# Patient Record
Sex: Male | Born: 2000 | Race: White | Hispanic: No | Marital: Single | Smoking: Never smoker
Health system: Southern US, Community
[De-identification: ages and names within clinical notes are randomized; demographics above are authoritative.]

---
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
## 2020-05-02 IMAGING — MR MRI OF THE RIGHT HIP WITH CONTRAST
4 series · 40 of 40 positions shown · IV contrast (agent unspecified)
Comparison: None.

CLINICAL DATA: right hip discomfort for 3-4 years recent with
peaking motion

EXAM:
MRI OF THE RIGHT HIP WITH CONTRAST
TECHNIQUE: Multiplanar, multisequence MR imaging was performed following the
administration of contrast.
CONTRAST:  intra-articular contrast

[Series 4: T2 fat-sat · coronal · 4.0mm · 1.19mm/px · 11 of 23 slices shown]
[im 1/23]
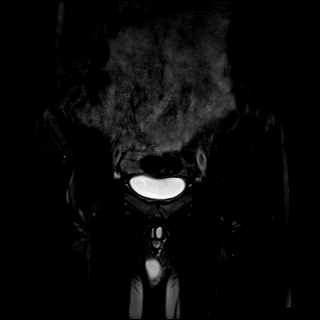
[im 3/23]
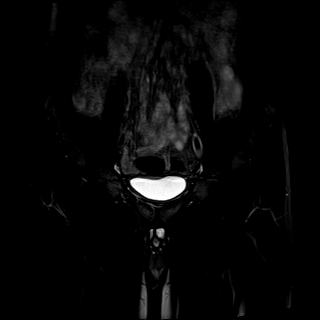
[im 5/23]
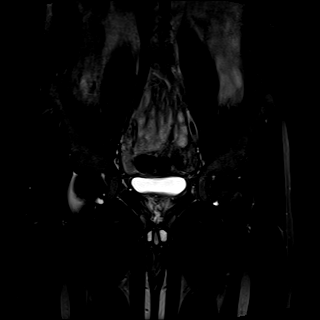
[im 7/23]
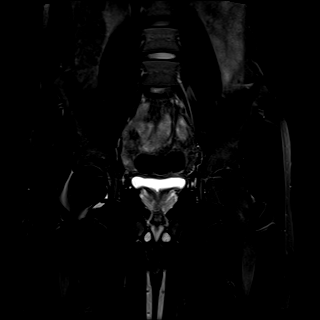
[im 9/23]
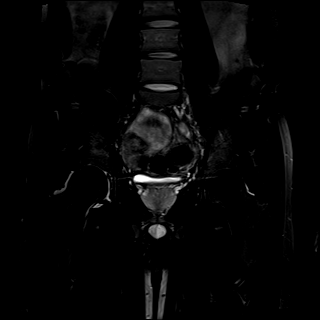
[im 12/23]
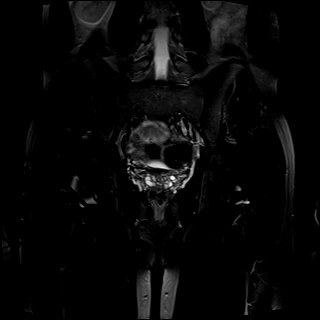
[im 14/23]
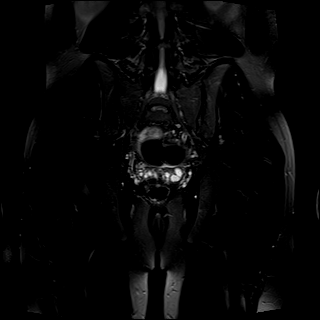
[im 16/23]
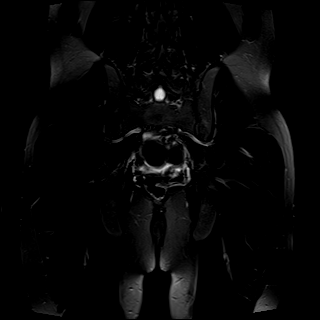
[im 18/23]
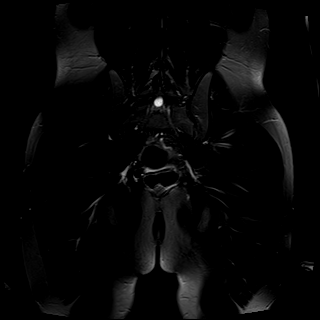
[im 20/23]
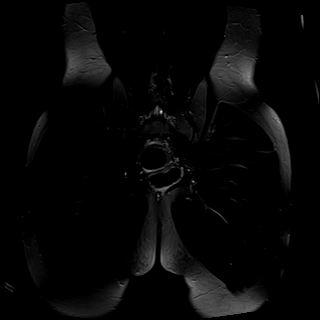
[im 23/23]
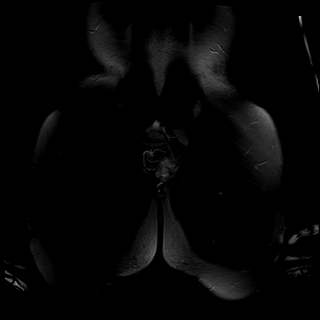

[Series 5: T1 fat-sat · coronal · 4.0mm · 0.56mm/px · 9 of 20 slices shown (1 of 3)]
[im 1/20]
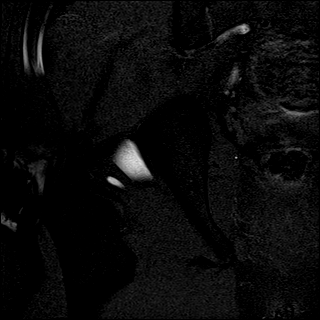
[im 3/20]
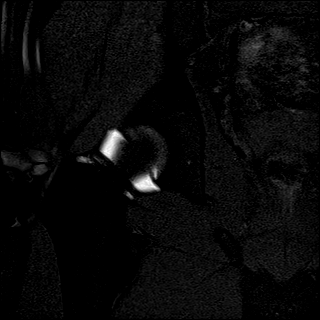
[im 5/20]
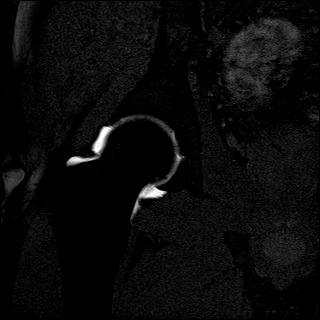
[im 8/20]
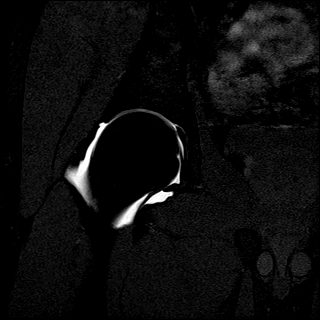
[im 10/20]
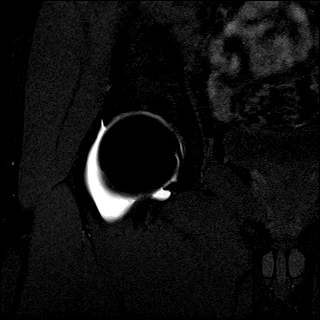
[im 12/20]
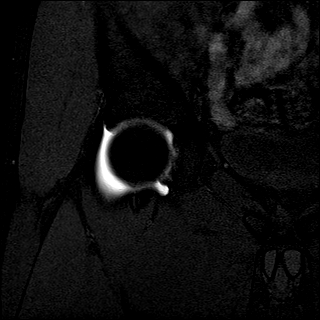
[im 15/20]
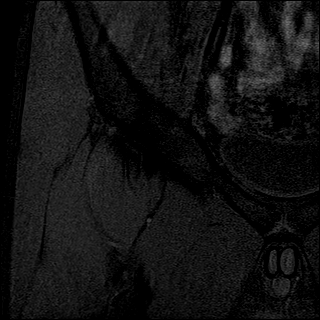
[im 17/20]
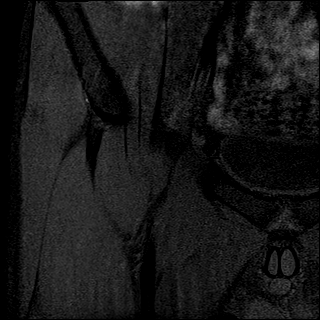
[im 20/20]
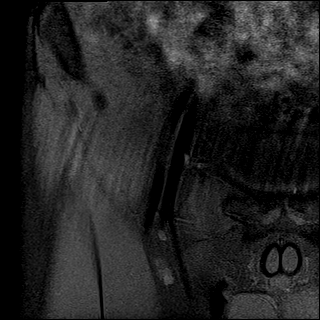

[Series 6: T1 fat-sat · sagittal · 4.0mm · 0.70mm/px · 12 of 27 slices shown (2 of 3)]
[im 1/27]
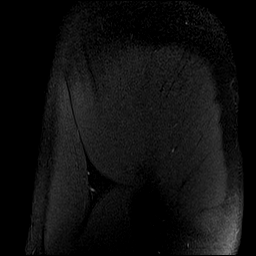
[im 3/27]
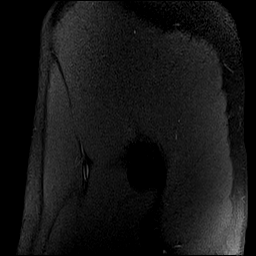
[im 5/27]
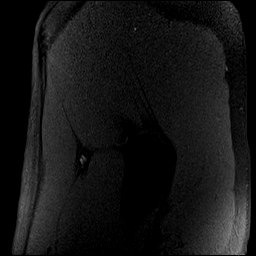
[im 8/27]
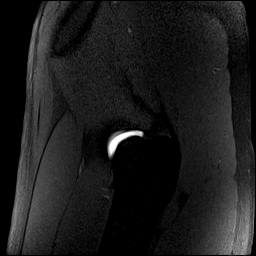
[im 10/27]
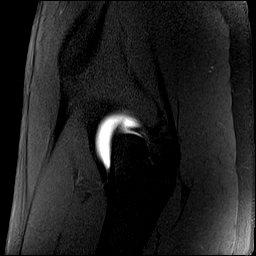
[im 12/27]
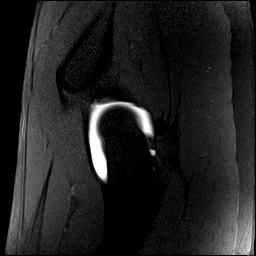
[im 15/27]
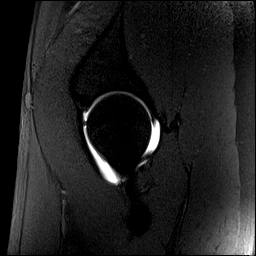
[im 17/27]
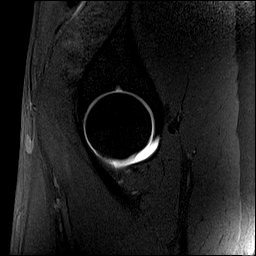
[im 19/27]
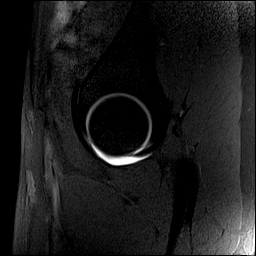
[im 22/27]
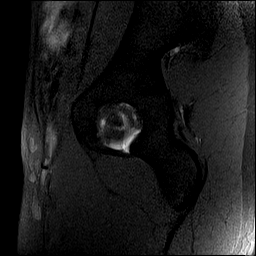
[im 24/27]
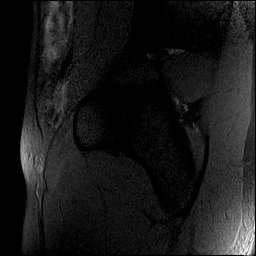
[im 27/27]
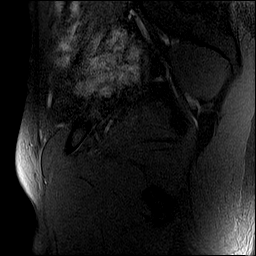

[Series 7: T1 fat-sat · axial · 4.0mm · 0.70mm/px · z∈[-85,-10]mm · 8 of 18 slices shown (3 of 3)]
[im 1/18]
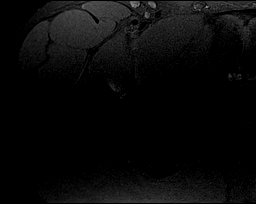
[im 3/18]
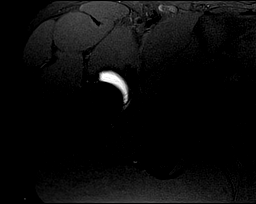
[im 5/18]
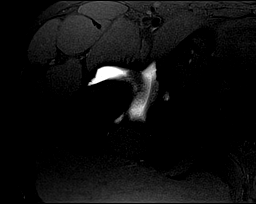
[im 8/18]
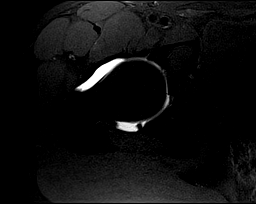
[im 10/18]
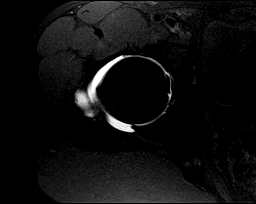
[im 13/18]
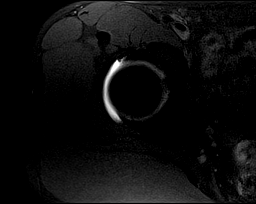
[im 15/18]
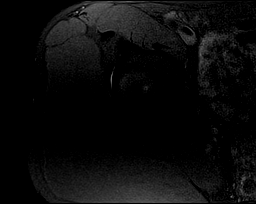
[im 18/18]
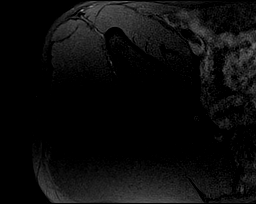

[40 of 40 positions shown; findings below may reference images not displayed]

FINDINGS: Bones: There is no evidence of acute fracture, dislocation or
avascular necrosis. There is a normal appearance to the femoral head
contour and shape. No osseous bump seen at the femoral head neck
junction. The visualized bony pelvis appears normal. The visualized
sacroiliac joints and symphysis pubis appear normal.

Articular cartilage and labrum

Articular cartilage: There is a tiny focal chondral defect seen
within the superior femoroacetabular joint measuring 4 mm best seen
on series 5, image 9. No displaced the chondral fragment is seen.
There is mild underlying subchondral marrow signal change. There
also appears to be a tiny focal chondral defect seen in the superior
left femoroacetabular joint.

Labrum: There is no gross labral tear or paralabral abnormality.

Joint or bursal effusion

Joint effusion: Adequate distention of intra-articular contrast.

Bursae: No focal periarticular fluid collection.

Muscles and tendons

Muscles and tendons: The visualized gluteus, hamstring and iliopsoas
tendons appear normal. The piriformis muscles appear symmetric.

Other findings

Miscellaneous: The visualized internal pelvic contents appear
unremarkable.
IMPRESSION: 4 mm focal chondral defect seen at the superior femoroacetabular
joint.
